# Patient Record
Sex: Female | Born: 1980 | Race: Black or African American | Hispanic: No | Marital: Single | Smoking: Never smoker
Health system: Southern US, Community
[De-identification: ages and names within clinical notes are randomized; demographics above are authoritative.]

## PROBLEM LIST (undated history)

## (undated) ENCOUNTER — Emergency Department (HOSPITAL_COMMUNITY): Discharge status: Against Medical Advice | Payer: Self-pay

## (undated) DIAGNOSIS — Z789 Other specified health status: Secondary | ICD-10-CM

## (undated) HISTORY — PX: COLON SURGERY: SHX602

---

## 2003-11-21 HISTORY — PX: TUBAL LIGATION: SHX77

## 2011-08-25 ENCOUNTER — Emergency Department (HOSPITAL_COMMUNITY)
Admission: EM | Admit: 2011-08-25 | Discharge: 2011-08-26 | Disposition: A | Discharge status: Home / Self Care | Payer: Self-pay | Attending: Emergency Medicine | Admitting: Emergency Medicine

## 2011-08-25 DIAGNOSIS — M722 Plantar fascial fibromatosis: Secondary | ICD-10-CM | POA: Insufficient documentation

## 2011-08-25 DIAGNOSIS — M7730 Calcaneal spur, unspecified foot: Secondary | ICD-10-CM | POA: Insufficient documentation

## 2011-08-25 DIAGNOSIS — M79609 Pain in unspecified limb: Secondary | ICD-10-CM | POA: Insufficient documentation

## 2011-08-26 ENCOUNTER — Emergency Department (HOSPITAL_COMMUNITY): Payer: Self-pay

## 2013-10-09 ENCOUNTER — Emergency Department (HOSPITAL_COMMUNITY)
Admission: EM | Admit: 2013-10-09 | Discharge: 2013-10-09 | Disposition: A | Discharge status: Home / Self Care | Payer: Self-pay | Attending: Emergency Medicine | Admitting: Emergency Medicine

## 2013-10-09 ENCOUNTER — Emergency Department (HOSPITAL_COMMUNITY): Payer: Self-pay

## 2013-10-09 DIAGNOSIS — M25561 Pain in right knee: Secondary | ICD-10-CM

## 2013-10-09 DIAGNOSIS — M25569 Pain in unspecified knee: Secondary | ICD-10-CM | POA: Insufficient documentation

## 2013-10-09 LAB — D-DIMER, QUANTITATIVE: D-Dimer, Quant: <0.27 ug/mL-FEU (ref 0.00–0.48)

## 2013-10-09 NOTE — ED Provider Notes (Signed)
CSN: [id number]     Arrival date & time 10/09/13  1514 History  This chart was scribed for non-physician practitioner working with Flint Melter, MD by Ashley Jacobs, ED scribe. This patient was seen in room WTR8/WTR8 and the patient's care was started at 3:33 PM.  First MD Initiated Contact with Patient 10/09/13 1542     Chief Complaint  Patient presents with  . Knee Pain   (Consider location/radiation/quality/duration/timing/severity/associated sxs/prior Treatment) The history is provided by the patient and medical records. No language interpreter was used.   HPI Comments: Gabriella Castillo is a 32 y.o. female who presents to the Emergency Department complaining of constant, moderate R knee pain the is gradually worsening for the past two weeks.  She denies taking anything for pain other than elevating her knee to aleve swelling. Pt states the pain is radiates from her R knee to her R dorsal calf and shins. The pain is worse is when she flexes her knee or ambulates. She currently works two jobs and lives a busy lifestyle. Pt denies recent surgeries, blood clotting disorder or any injuries. Pt also denies any prior similar incident. She does not have any allergies to medications or any prior medical complications.  No past medical history on file. No past surgical history on file. No family history on file. History  Substance Use Topics  . Smoking status: Not on file  . Smokeless tobacco: Not on file  . Alcohol Use: Not on file   OB History   No data available     Review of Systems  Musculoskeletal: Positive for arthralgias, joint swelling and myalgias.       Right knee pain  Hematological: Does not bruise/bleed easily.  All other systems reviewed and are negative.    Allergies  Review of patient's allergies indicates not on file.  Home Medications  No current outpatient prescriptions on file. BP 122/52  Pulse 89  Temp(Src) 98.6 F (37 C) (Oral)  Resp 16  SpO2  100% Physical Exam  Nursing note and vitals reviewed. Constitutional: She is oriented to person, place, and time. She appears well-developed and well-nourished. No distress.  HENT:  Head: Normocephalic and atraumatic.  Right Ear: External ear normal.  Left Ear: External ear normal.  Nose: Nose normal.  Mouth/Throat: Oropharynx is clear and moist.  Eyes: Conjunctivae and EOM are normal. Pupils are equal, round, and reactive to light.  Neck: Normal range of motion. Neck supple. No tracheal deviation present.  Cardiovascular: Normal rate, regular rhythm and normal heart sounds.   Pulmonary/Chest: Effort normal and breath sounds normal. No stridor. No respiratory distress. She has no wheezes. She has no rales.  Abdominal: Soft. She exhibits no distension.  Musculoskeletal: Normal range of motion.       Right knee: She exhibits swelling. She exhibits normal range of motion, no ecchymosis, no deformity, no laceration and no erythema. Tenderness found. Medial joint line, lateral joint line and patellar tendon tenderness noted.  TTP diffusely over right knee. No apprehension sign. Joint stable. Mild swelling and crepitus noted. Compartment soft, no erythema, streaking. Neurovascularly intact.   Neurological: She is alert and oriented to person, place, and time. She has normal strength.  Skin: Skin is warm and dry. She is not diaphoretic. No erythema.  Psychiatric: She has a normal mood and affect. Her behavior is normal.    ED Course  Procedures (including critical care time) DIAGNOSTIC STUDIES: Oxygen Saturation is 100% on room air, normal by my interpretation.  COORDINATION OF CARE: 3:47 PM Discussed course of care with pt which includes right knee x-ray. Pt understands and agrees.  Labs Review Labs Reviewed  D-DIMER, QUANTITATIVE   Imaging Review Dg Knee Complete 4 Views Right  10/09/2013   CLINICAL DATA:  Knee pain.  EXAM: RIGHT KNEE - COMPLETE 4+ VIEW  COMPARISON:  None.   FINDINGS: There is no evidence of fracture, dislocation, or joint effusion. There is no evidence of arthropathy or other focal bone abnormality. Soft tissues are unremarkable.  IMPRESSION: Negative.   Electronically Signed   By: Maisie Fus  Register   On: 10/09/2013 16:40    EKG Interpretation   None       MDM   1. Right knee pain    Patient presents with 2 weeks of right knee pain and swelling. The pain radiates into her right calf. D-dimer was negative and x-ray is unremarkable. No concern for DVT. She was given a knee sleeve and instructed to followup. Return instructions given. Vital signs stable for discharge. Patient / Family / Caregiver informed of clinical course, understand medical decision-making process, and agree with plan.   I personally performed the services described in this documentation, which was scribed in my presence. The recorded information has been reviewed and is accurate.    Mora Bellman, PA-C 10/09/13 1815

## 2013-10-09 NOTE — ED Notes (Signed)
Pt c/o R knee pain and swelling for the past 2 weeks. Pt denies any injury or trauma to knee. Pt ambulatory to exam room with steady gait.

## 2013-10-09 NOTE — ED Provider Notes (Signed)
Medical screening examination/treatment/procedure(s) were performed by non-physician practitioner and as supervising physician I was immediately available for consultation/collaboration.  Flint Melter, MD 10/09/13 [phone]
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
## 2015-11-01 ENCOUNTER — Emergency Department (HOSPITAL_COMMUNITY)
Admission: EM | Admit: 2015-11-01 | Discharge: 2015-11-01 | Disposition: A | Discharge status: Home / Self Care | Payer: 59 | Attending: Emergency Medicine | Admitting: Emergency Medicine

## 2015-11-01 ENCOUNTER — Encounter (HOSPITAL_COMMUNITY): Payer: Self-pay | Admitting: Emergency Medicine

## 2015-11-01 ENCOUNTER — Emergency Department (HOSPITAL_COMMUNITY): Payer: 59

## 2015-11-01 DIAGNOSIS — R2241 Localized swelling, mass and lump, right lower limb: Secondary | ICD-10-CM | POA: Insufficient documentation

## 2015-11-01 DIAGNOSIS — M25561 Pain in right knee: Secondary | ICD-10-CM | POA: Insufficient documentation

## 2015-11-01 MED ORDER — IBUPROFEN 200 MG PO TABS
600.0000 mg | ORAL_TABLET | Freq: Once | ORAL | Status: AC
  Administered 2015-11-01: 600 mg via ORAL
  Filled 2015-11-01: qty 3

## 2015-11-01 MED ORDER — IBUPROFEN 600 MG PO TABS: 600.0000 mg | ORAL_TABLET | Freq: Four times a day (QID) | ORAL | Status: AC | PRN

## 2015-11-01 NOTE — ED Notes (Signed)
PT RETURNED FROM XRAY.

## 2015-11-01 NOTE — ED Notes (Signed)
INITIAL ASSESSMENT COMPLETED. PT C/O RIGHT KNEE PAIN AND SWELLING X1 WEEK. DENIES INJURY. AWAITING FURTHER ORDERS.

## 2015-11-01 NOTE — ED Notes (Signed)
Pt reports waking up with knee pain and swelling last week. Wanted to give it a week to see if it would get better, but it has gotten worse. Pt does not recall any injury or precipitating factors. Pain 8/10. Denies taking any medication for it.

## 2015-11-01 NOTE — ED Provider Notes (Signed)
CSN: [id number]     Arrival date & time 11/01/15  0029 History   First MD Initiated Contact with Patient 11/01/15 0047     Chief Complaint  Patient presents with  . Knee Pain     (Consider location/radiation/quality/duration/timing/severity/associated sxs/prior Treatment) Patient is a 34 y.o. female presenting with knee pain. The history is provided by the patient.  Knee Pain Location:  Knee Time since incident:  1 week Injury: no   Knee location:  R knee Pain details:    Quality:  Dull   Radiates to:  Does not radiate   Severity:  Moderate   Onset quality:  Gradual   Duration:  1 week   Timing:  Constant   Progression:  Worsening Chronicity:  New Dislocation: no   Foreign body present:  No foreign bodies Tetanus status:  Unknown Prior injury to area:  No Relieved by:  None tried Worsened by:  Activity Ineffective treatments:  None tried   History reviewed. No pertinent past medical history. No past surgical history on file. History reviewed. No pertinent family history. Social History  Substance Use Topics  . Smoking status: None  . Smokeless tobacco: None  . Alcohol Use: None   OB History    No data available     Review of Systems  Constitutional: Negative for chills.  Musculoskeletal: Positive for joint swelling and gait problem.  Skin: Negative for color change.      Allergies  Review of patient's allergies indicates no known allergies.  Home Medications   Prior to Admission medications   Medication Sig Start Date End Date Taking? Authorizing Provider  ibuprofen (ADVIL,MOTRIN) 600 MG tablet Take 1 tablet (600 mg total) by mouth every 6 (six) hours as needed. 11/01/15   Earley FavorGail Gelsey Amyx, NP   BP 111/68 mmHg  Pulse 69  Temp(Src) 98.2 F (36.8 C) (Oral)  Resp 18  Ht 5' 11.5" (1.816 m)  Wt 87.998 kg  BMI 26.68 kg/m2  SpO2 98%  LMP 10/18/2015 Physical Exam  Constitutional: She appears well-developed and well-nourished.  HENT:  Head:  Normocephalic.  Eyes: Pupils are equal, round, and reactive to light.  Neck: Normal range of motion.  Cardiovascular: Normal rate.   Pulmonary/Chest: Effort normal.  Musculoskeletal: Normal range of motion. She exhibits tenderness. She exhibits no edema.       Right knee: She exhibits swelling. She exhibits normal range of motion, no ecchymosis, no deformity, no laceration, no erythema, normal alignment and no bony tenderness. Tenderness found. Medial joint line tenderness noted.  Neurological: She is alert.  Skin: Skin is warm.  Psychiatric: She has a normal mood and affect.  Nursing note and vitals reviewed.   ED Course  Procedures (including critical care time) Labs Review Labs Reviewed - No data to display  Imaging Review Dg Knee Complete 4 Views Right  11/01/2015  CLINICAL DATA:  Lateral to mid right knee pain today. No injury. Knot on the lateral side of the patella. EXAM: RIGHT KNEE - COMPLETE 4+ VIEW COMPARISON:  10/09/2013 FINDINGS: There is no evidence of fracture, dislocation, or joint effusion. There is no evidence of arthropathy or other focal bone abnormality. Soft tissues are unremarkable. IMPRESSION: Negative. Electronically Signed   By: Burman NievesWilliam  Stevens M.D.   On: 11/01/2015 01:38   I have personally reviewed and evaluated these images and lab results as part of my medical decision-making.   EKG Interpretation None    will treat with knee sleeve and antiinflamatories  MDM  Final diagnoses:  Right knee pain         Earley Favor, NP 11/01/15 0217  April Palumbo, MD 12/15/15 2330

## 2015-11-01 NOTE — Discharge Instructions (Signed)
How to Use a Knee Brace °A knee brace is a device that you wear to support your knee, especially if the knee is healing after an injury or surgery. There are several types of knee braces. Some are designed to prevent an injury (prophylactic brace). These are often worn during sports. Others support an injured knee (functional brace) or keep it still while it heals (rehabilitative brace). People with severe arthritis of the knee may benefit from a brace that takes some pressure off the knee (unloader brace). Most knee braces are made from a combination of cloth and metal or plastic.  °You may need to wear a knee brace to: °· Relieve knee pain. °· Help your knee support your weight (improve stability). °· Help you walk farther (improve mobility). °· Prevent injury. °· Support your knee while it heals from surgery or from an injury. °RISKS AND COMPLICATIONS °Generally, knee braces are very safe to wear. However, problems may occur, including: °· Skin irritation that may lead to infection. °· Making your condition worse if you wear the brace in the wrong way. °HOW TO USE A KNEE BRACE °Different braces will have different instructions for use. Your health care provider will tell you or show you: °· How to put on your brace. °· How to adjust the brace. °· When and how often to wear the brace. °· How to remove the brace. °· If you will need any assistive devices in addition to the brace, such as crutches or a cane. °In general, your brace should: °· Have the hinge of the brace line up with the bend of your knee. °· Have straps, hooks, or tapes that fasten snugly around your leg. °· Not feel too tight or too loose.  °HOW TO CARE FOR A KNEE BRACE °· Check your brace often for signs of damage, such as loose connections or attachments. Your knee brace may get damaged or wear out during normal use. °· Wash the fabric parts of your brace with soap and water. °· Read the insert that comes with your brace for other specific care  instructions.  °SEEK MEDICAL CARE IF: °· Your knee brace is too loose or too tight and you cannot adjust it. °· Your knee brace causes skin redness, swelling, bruising, or irritation. °· Your knee brace is not helping. °· Your knee brace is making your knee pain worse. °  °This information is not intended to replace advice given to you by your health care provider. Make sure you discuss any questions you have with your health care provider. °  °Document Released: 01/27/2004 Document Revised: 07/28/2015 Document Reviewed: 03/01/2015 °Elsevier Interactive Patient Education ©2016 [employer] ° °Heat Therapy °Heat therapy can help ease sore, stiff, injured, and tight muscles and joints. Heat relaxes your muscles, which may help ease your pain. Heat therapy should only be used on old, pre-existing, or long-lasting (chronic) injuries. Do not use heat therapy unless told by your doctor. °HOW TO USE HEAT THERAPY °There are several different kinds of heat therapy, including: °· Moist heat pack. °· Warm water bath. °· Hot water bottle. °· Electric heating pad. °· Heated gel pack. °· Heated wrap. °· Electric heating pad. °GENERAL HEAT THERAPY RECOMMENDATIONS  °· Do not sleep while using heat therapy. Only use heat therapy while you are awake. °· Your skin may turn pink while using heat therapy. Do not use heat therapy if your skin turns red. °· Do not use heat therapy if you have new pain. °· High heat   or long exposure to heat can cause burns. Be careful when using heat therapy to avoid burning your skin.  Do not use heat therapy on areas of your skin that are already irritated, such as with a rash or sunburn. GET HELP IF:   You have blisters, redness, swelling (puffiness), or numbness.  You have new pain.  Your pain is worse. MAKE SURE YOU:  Understand these instructions.  Will watch your condition.  Will get help right away if you are not doing well or get worse.   This information is not intended to  replace advice given to you by your health care provider. Make sure you discuss any questions you have with your health care provider.   Document Released: 01/29/2012 Document Revised: 11/27/2014 Document Reviewed: 12/30/2013 Elsevier Interactive Patient Education 2016 [employer] Your xray is normal please wear the sleeve while up and about for the next several weeks  Take the Ibuprofen on a regular basis as well

## 2015-11-01 NOTE — ED Notes (Signed)
PT DISCHARGED. INSTRUCTIONS AND PRESCRIPTION GIVEN. AAOX3. PT IN NO APPARENT DISTRESS. THE OPPORTUNITY TO ASK QUESTIONS WAS PROVIDED. 

## 2015-11-01 NOTE — ED Notes (Signed)
Patient transported to X-ray 

## 2016-02-23 ENCOUNTER — Emergency Department (HOSPITAL_COMMUNITY): Payer: 59

## 2016-02-23 ENCOUNTER — Inpatient Hospital Stay (HOSPITAL_COMMUNITY)
Admission: EM | Admit: 2016-02-23 | Discharge: 2016-02-28 | DRG: 330 | Disposition: A | Discharge status: Home Health | Payer: 59 | Attending: Surgery | Admitting: Surgery

## 2016-02-23 ENCOUNTER — Encounter (HOSPITAL_COMMUNITY): Payer: Self-pay | Admitting: *Deleted

## 2016-02-23 ENCOUNTER — Encounter (HOSPITAL_COMMUNITY): Admission: EM | Disposition: A | Discharge status: Home Health | Payer: 59 | Source: Home / Self Care

## 2016-02-23 ENCOUNTER — Emergency Department (HOSPITAL_COMMUNITY): Payer: 59 | Admitting: Certified Registered"

## 2016-02-23 DIAGNOSIS — K567 Ileus, unspecified: Secondary | ICD-10-CM | POA: Diagnosis not present

## 2016-02-23 DIAGNOSIS — S31600A Unspecified open wound of abdominal wall, right upper quadrant with penetration into peritoneal cavity, initial encounter: Principal | ICD-10-CM | POA: Diagnosis present

## 2016-02-23 DIAGNOSIS — T148 Other injury of unspecified body region: Secondary | ICD-10-CM | POA: Diagnosis present

## 2016-02-23 DIAGNOSIS — W3400XA Accidental discharge from unspecified firearms or gun, initial encounter: Secondary | ICD-10-CM

## 2016-02-23 DIAGNOSIS — Z9889 Other specified postprocedural states: Secondary | ICD-10-CM

## 2016-02-23 DIAGNOSIS — S31601A Unspecified open wound of abdominal wall, left upper quadrant with penetration into peritoneal cavity, initial encounter: Secondary | ICD-10-CM | POA: Diagnosis present

## 2016-02-23 DIAGNOSIS — D62 Acute posthemorrhagic anemia: Secondary | ICD-10-CM | POA: Diagnosis not present

## 2016-02-23 DIAGNOSIS — S36409A Unspecified injury of unspecified part of small intestine, initial encounter: Secondary | ICD-10-CM | POA: Diagnosis present

## 2016-02-23 DIAGNOSIS — S31139A Puncture wound of abdominal wall without foreign body, unspecified quadrant without penetration into peritoneal cavity, initial encounter: Secondary | ICD-10-CM

## 2016-02-23 HISTORY — PX: SMALL INTESTINE SURGERY: SHX150

## 2016-02-23 HISTORY — PX: EXPLORATORY LAPAROTOMY: SUR591

## 2016-02-23 HISTORY — PX: LAPAROTOMY: SHX154

## 2016-02-23 HISTORY — DX: Other specified health status: Z78.9

## 2016-02-23 LAB — PREPARE FRESH FROZEN PLASMA
Unit division: 0
Unit division: 0

## 2016-02-23 LAB — PROTIME-INR
INR: 1.02 (ref 0.00–1.49)
Prothrombin Time: 13.6 seconds (ref 11.6–15.2)

## 2016-02-23 LAB — TYPE AND SCREEN
ABO/RH(D): B POS
Antibody Screen: NEGATIVE
UNIT DIVISION: 0
Unit division: 0

## 2016-02-23 LAB — COMPREHENSIVE METABOLIC PANEL
ALBUMIN: 3.6 g/dL (ref 3.5–5.0)
ALK PHOS: 48 U/L (ref 38–126)
ALK PHOS: 46 U/L (ref 38–126)
ALT: 16 U/L (ref 14–54)
ALT: 20 U/L (ref 14–54)
AST: 19 U/L (ref 15–41)
AST: 26 U/L (ref 15–41)
Albumin: 3.7 g/dL (ref 3.5–5.0)
Anion gap: 14 (ref 5–15)
Anion gap: 13 (ref 5–15)
BILIRUBIN TOTAL: 0.5 mg/dL (ref 0.3–1.2)
BUN: 14 mg/dL (ref 6–20)
BUN: 9 mg/dL (ref 6–20)
CALCIUM: 8.9 mg/dL (ref 8.9–10.3)
CALCIUM: 8.4 mg/dL — AB (ref 8.9–10.3)
CO2: 19 mmol/L — AB (ref 22–32)
CO2: 23 mmol/L (ref 22–32)
CREATININE: 0.95 mg/dL (ref 0.44–1.00)
Chloride: 105 mmol/L (ref 101–111)
Chloride: 103 mmol/L (ref 101–111)
Creatinine, Ser: 0.75 mg/dL (ref 0.44–1.00)
GFR calc Af Amer: >60 mL/min (ref >60)
GFR calc non Af Amer: >60 mL/min (ref >60)
GFR calc non Af Amer: >60 mL/min (ref >60)
GLUCOSE: 142 mg/dL — AB (ref 65–99)
Glucose, Bld: 134 mg/dL — ABNORMAL HIGH (ref 65–99)
Potassium: 3.4 mmol/L — ABNORMAL LOW (ref 3.5–5.1)
Potassium: 3.9 mmol/L (ref 3.5–5.1)
SODIUM: 138 mmol/L (ref 135–145)
Sodium: 139 mmol/L (ref 135–145)
TOTAL PROTEIN: 6.5 g/dL (ref 6.5–8.1)
Total Bilirubin: 0.4 mg/dL (ref 0.3–1.2)
Total Protein: 6.8 g/dL (ref 6.5–8.1)

## 2016-02-23 LAB — I-STAT BETA HCG BLOOD, ED (MC, WL, AP ONLY)

## 2016-02-23 LAB — CDS SEROLOGY

## 2016-02-23 LAB — CBC
HCT: 38.6 % (ref 36.0–46.0)
HEMATOCRIT: 43 % (ref 36.0–46.0)
HEMOGLOBIN: 14.1 g/dL (ref 12.0–15.0)
Hemoglobin: 12.3 g/dL (ref 12.0–15.0)
MCH: 29 pg (ref 26.0–34.0)
MCH: 29.5 pg (ref 26.0–34.0)
MCHC: 31.9 g/dL (ref 30.0–36.0)
MCHC: 32.8 g/dL (ref 30.0–36.0)
MCV: 91 fL (ref 78.0–100.0)
MCV: 90 fL (ref 78.0–100.0)
PLATELETS: 214 10*3/uL (ref 150–400)
Platelets: 143 10*3/uL — ABNORMAL LOW (ref 150–400)
RBC: 4.24 MIL/uL (ref 3.87–5.11)
RBC: 4.78 MIL/uL (ref 3.87–5.11)
RDW: 12.7 % (ref 11.5–15.5)
RDW: 12.8 % (ref 11.5–15.5)
WBC: 8.8 10*3/uL (ref 4.0–10.5)
WBC: 9.9 10*3/uL (ref 4.0–10.5)

## 2016-02-23 LAB — ABO/RH: ABO/RH(D): B POS

## 2016-02-23 LAB — ETHANOL

## 2016-02-23 SURGERY — LAPAROTOMY, EXPLORATORY
Anesthesia: General | Site: Abdomen

## 2016-02-23 MED ORDER — ONDANSETRON 4 MG PO TBDP: 4.0000 mg | ORAL_TABLET | Freq: Four times a day (QID) | ORAL | Status: DC | PRN

## 2016-02-23 MED ORDER — 0.9 % SODIUM CHLORIDE (POUR BTL) OPTIME
TOPICAL | Status: DC | PRN
  Administered 2016-02-23: 5000 mL

## 2016-02-23 MED ORDER — FENTANYL CITRATE (PF) 250 MCG/5ML IJ SOLN
INTRAMUSCULAR | Status: AC
  Filled 2016-02-23: qty 5

## 2016-02-23 MED ORDER — SUGAMMADEX SODIUM 500 MG/5ML IV SOLN
INTRAVENOUS | Status: AC
  Filled 2016-02-23: qty 5

## 2016-02-23 MED ORDER — PROPOFOL 10 MG/ML IV BOLUS
INTRAVENOUS | Status: DC | PRN
  Administered 2016-02-23: 130 mg via INTRAVENOUS

## 2016-02-23 MED ORDER — ONDANSETRON HCL 4 MG/2ML IJ SOLN
INTRAMUSCULAR | Status: DC | PRN
  Administered 2016-02-23: 4 mg via INTRAVENOUS

## 2016-02-23 MED ORDER — DIPHENHYDRAMINE HCL 50 MG/ML IJ SOLN: 12.5000 mg | Freq: Four times a day (QID) | INTRAMUSCULAR | Status: DC | PRN

## 2016-02-23 MED ORDER — LIDOCAINE HCL (CARDIAC) 20 MG/ML IV SOLN
INTRAVENOUS | Status: AC
  Filled 2016-02-23: qty 10

## 2016-02-23 MED ORDER — SODIUM CHLORIDE 0.9 % IV SOLN
INTRAVENOUS | Status: DC | PRN
  Administered 2016-02-23: 02:00:00 via INTRAVENOUS

## 2016-02-23 MED ORDER — SUGAMMADEX SODIUM 200 MG/2ML IV SOLN
INTRAVENOUS | Status: DC | PRN
  Administered 2016-02-23: 200 mg via INTRAVENOUS

## 2016-02-23 MED ORDER — ONDANSETRON HCL 4 MG/2ML IJ SOLN
4.0000 mg | Freq: Once | INTRAMUSCULAR | Status: AC
  Administered 2016-02-23: 4 mg via INTRAVENOUS

## 2016-02-23 MED ORDER — ONDANSETRON HCL 4 MG/2ML IJ SOLN: 4.0000 mg | Freq: Four times a day (QID) | INTRAMUSCULAR | Status: DC | PRN

## 2016-02-23 MED ORDER — NALOXONE HCL 0.4 MG/ML IJ SOLN: 0.4000 mg | INTRAMUSCULAR | Status: DC | PRN

## 2016-02-23 MED ORDER — PIPERACILLIN-TAZOBACTAM 3.375 G IVPB
3.3750 g | Freq: Three times a day (TID) | INTRAVENOUS | Status: AC
  Administered 2016-02-23 – 2016-02-27 (×12): 3.375 g via INTRAVENOUS
  Filled 2016-02-23 (×15): qty 50

## 2016-02-23 MED ORDER — ENOXAPARIN SODIUM 40 MG/0.4ML ~~LOC~~ SOLN
40.0000 mg | SUBCUTANEOUS | Status: DC
  Administered 2016-02-24 – 2016-02-28 (×5): 40 mg via SUBCUTANEOUS
  Filled 2016-02-23 (×4): qty 0.4

## 2016-02-23 MED ORDER — ONDANSETRON HCL 4 MG/2ML IJ SOLN: 4.0000 mg | Freq: Once | INTRAMUSCULAR | Status: DC | PRN

## 2016-02-23 MED ORDER — LIDOCAINE HCL (CARDIAC) 20 MG/ML IV SOLN
INTRAVENOUS | Status: DC | PRN
  Administered 2016-02-23: 100 mg via INTRAVENOUS

## 2016-02-23 MED ORDER — PHENYLEPHRINE HCL 10 MG/ML IJ SOLN
INTRAMUSCULAR | Status: DC | PRN
Start: 2016-02-23 — End: 2016-02-23
  Administered 2016-02-23: 160 ug via INTRAVENOUS
  Administered 2016-02-23: 80 ug via INTRAVENOUS

## 2016-02-23 MED ORDER — GLYCOPYRROLATE 0.2 MG/ML IJ SOLN
INTRAMUSCULAR | Status: AC
  Filled 2016-02-23: qty 3

## 2016-02-23 MED ORDER — IOPAMIDOL (ISOVUE-300) INJECTION 61%
100.0000 mL | Freq: Once | INTRAVENOUS | Status: AC | PRN
Start: 2016-02-23 — End: 2016-02-23
  Administered 2016-02-23: 100 mL via INTRAVENOUS

## 2016-02-23 MED ORDER — ROCURONIUM BROMIDE 100 MG/10ML IV SOLN
INTRAVENOUS | Status: DC | PRN
  Administered 2016-02-23: 25 mg via INTRAVENOUS

## 2016-02-23 MED ORDER — SUGAMMADEX SODIUM 200 MG/2ML IV SOLN
INTRAVENOUS | Status: AC
  Filled 2016-02-23: qty 2

## 2016-02-23 MED ORDER — PANTOPRAZOLE SODIUM 40 MG IV SOLR
40.0000 mg | Freq: Every day | INTRAVENOUS | Status: DC
  Administered 2016-02-23 – 2016-02-26 (×4): 40 mg via INTRAVENOUS
  Filled 2016-02-23 (×4): qty 40

## 2016-02-23 MED ORDER — MEPERIDINE HCL 25 MG/ML IJ SOLN: 6.2500 mg | INTRAMUSCULAR | Status: DC | PRN

## 2016-02-23 MED ORDER — HYDROMORPHONE HCL 1 MG/ML IJ SOLN: 0.2500 mg | INTRAMUSCULAR | Status: DC | PRN

## 2016-02-23 MED ORDER — MIDAZOLAM HCL 2 MG/2ML IJ SOLN
INTRAMUSCULAR | Status: AC
  Filled 2016-02-23: qty 2

## 2016-02-23 MED ORDER — ROCURONIUM BROMIDE 50 MG/5ML IV SOLN
INTRAVENOUS | Status: AC
  Filled 2016-02-23: qty 1

## 2016-02-23 MED ORDER — MIDAZOLAM HCL 5 MG/5ML IJ SOLN
INTRAMUSCULAR | Status: DC | PRN
  Administered 2016-02-23 (×2): 1 mg via INTRAVENOUS

## 2016-02-23 MED ORDER — DEXTROSE 5 % IV SOLN
2.0000 g | Freq: Once | INTRAVENOUS | Status: AC
  Administered 2016-02-23: 2 g via INTRAVENOUS
  Filled 2016-02-23: qty 2

## 2016-02-23 MED ORDER — DEXAMETHASONE SODIUM PHOSPHATE 4 MG/ML IJ SOLN
INTRAMUSCULAR | Status: DC | PRN
  Administered 2016-02-23: 4 mg via INTRAVENOUS

## 2016-02-23 MED ORDER — FENTANYL CITRATE (PF) 100 MCG/2ML IJ SOLN
INTRAMUSCULAR | Status: DC | PRN
  Administered 2016-02-23 (×2): 50 ug via INTRAVENOUS
  Administered 2016-02-23: 100 ug via INTRAVENOUS

## 2016-02-23 MED ORDER — HYDROMORPHONE 1 MG/ML IV SOLN
INTRAVENOUS | Status: AC
  Filled 2016-02-23: qty 25

## 2016-02-23 MED ORDER — DIPHENHYDRAMINE HCL 12.5 MG/5ML PO ELIX: 12.5000 mg | ORAL_SOLUTION | Freq: Four times a day (QID) | ORAL | Status: DC | PRN

## 2016-02-23 MED ORDER — PHENYLEPHRINE HCL 10 MG/ML IJ SOLN
10.0000 mg | INTRAVENOUS | Status: DC | PRN
  Administered 2016-02-23: 50 ug/min via INTRAVENOUS

## 2016-02-23 MED ORDER — HYDROMORPHONE 1 MG/ML IV SOLN
INTRAVENOUS | Status: DC
  Administered 2016-02-23 (×2): 0.6 mg via INTRAVENOUS
  Administered 2016-02-23: 0.9 mg via INTRAVENOUS
  Administered 2016-02-23: 04:00:00 via INTRAVENOUS
  Administered 2016-02-23: 0.6 mg via INTRAVENOUS
  Administered 2016-02-24 (×2): 0.3 mg via INTRAVENOUS
  Administered 2016-02-24: 1.2 mg via INTRAVENOUS
  Administered 2016-02-24: 0.3 mg via INTRAVENOUS
  Administered 2016-02-24: 0.6 mg via INTRAVENOUS
  Administered 2016-02-24 – 2016-02-25 (×3): 0.3 mg via INTRAVENOUS
  Administered 2016-02-25: 0.6 mg via INTRAVENOUS
  Administered 2016-02-25 (×2): 0.3 mg via INTRAVENOUS
  Administered 2016-02-25: 1.2 mg via INTRAVENOUS
  Administered 2016-02-26: 0.9 mg via INTRAVENOUS
  Administered 2016-02-26 (×2): 0.3 mg via INTRAVENOUS

## 2016-02-23 MED ORDER — SODIUM CHLORIDE 0.9% FLUSH: 9.0000 mL | INTRAVENOUS | Status: DC | PRN

## 2016-02-23 MED ORDER — POTASSIUM CHLORIDE IN NACL 20-0.9 MEQ/L-% IV SOLN
INTRAVENOUS | Status: DC
  Administered 2016-02-23 – 2016-02-26 (×6): via INTRAVENOUS
  Filled 2016-02-23 (×5): qty 1000
  Filled 2016-02-23: qty 2000
  Filled 2016-02-23 (×2): qty 1000

## 2016-02-23 MED ORDER — LACTATED RINGERS IV SOLN
INTRAVENOUS | Status: DC | PRN
  Administered 2016-02-23 (×2): via INTRAVENOUS

## 2016-02-23 MED ORDER — ONDANSETRON HCL 4 MG/2ML IJ SOLN
INTRAMUSCULAR | Status: AC
  Filled 2016-02-23: qty 2

## 2016-02-23 MED ORDER — MENTHOL 3 MG MT LOZG
1.0000 | LOZENGE | OROMUCOSAL | Status: DC | PRN
  Filled 2016-02-23: qty 9

## 2016-02-23 MED ORDER — SODIUM CHLORIDE 0.9 % IJ SOLN
INTRAMUSCULAR | Status: AC
  Filled 2016-02-23: qty 10

## 2016-02-23 MED ORDER — SUCCINYLCHOLINE CHLORIDE 20 MG/ML IJ SOLN
INTRAMUSCULAR | Status: DC | PRN
  Administered 2016-02-23: 140 mg via INTRAVENOUS

## 2016-02-23 SURGICAL SUPPLY — 43 items
BLADE SURG ROTATE 9660 (MISCELLANEOUS) ×3 IMPLANT
BNDG GAUZE ELAST 4 BULKY (GAUZE/BANDAGES/DRESSINGS) ×3 IMPLANT
CANISTER SUCTION 2500CC (MISCELLANEOUS) ×3 IMPLANT
CHLORAPREP W/TINT 26ML (MISCELLANEOUS) ×3 IMPLANT
COVER SURGICAL LIGHT HANDLE (MISCELLANEOUS) ×3 IMPLANT
DRAPE LAPAROSCOPIC ABDOMINAL (DRAPES) ×3 IMPLANT
DRAPE WARM FLUID 44X44 (DRAPE) ×3 IMPLANT
DRSG OPSITE POSTOP 4X10 (GAUZE/BANDAGES/DRESSINGS) IMPLANT
DRSG OPSITE POSTOP 4X8 (GAUZE/BANDAGES/DRESSINGS) IMPLANT
DRSG PAD ABDOMINAL 8X10 ST (GAUZE/BANDAGES/DRESSINGS) ×3 IMPLANT
ELECT BLADE 6.5 EXT (BLADE) IMPLANT
ELECT CAUTERY BLADE 6.4 (BLADE) ×6 IMPLANT
ELECT REM PT RETURN 9FT ADLT (ELECTROSURGICAL) ×3
ELECTRODE REM PT RTRN 9FT ADLT (ELECTROSURGICAL) ×1 IMPLANT
GAUZE SPONGE 4X4 12PLY STRL (GAUZE/BANDAGES/DRESSINGS) ×3 IMPLANT
GLOVE BIO SURGEON STRL SZ7 (GLOVE) ×3 IMPLANT
GLOVE BIOGEL PI IND STRL 7.5 (GLOVE) ×1 IMPLANT
GLOVE BIOGEL PI INDICATOR 7.5 (GLOVE) ×2
GOWN STRL REUS W/ TWL LRG LVL3 (GOWN DISPOSABLE) ×2 IMPLANT
GOWN STRL REUS W/TWL LRG LVL3 (GOWN DISPOSABLE) ×4
KIT BASIN OR (CUSTOM PROCEDURE TRAY) ×3 IMPLANT
KIT ROOM TURNOVER OR (KITS) ×3 IMPLANT
LIGASURE IMPACT 36 18CM CVD LR (INSTRUMENTS) ×3 IMPLANT
NS IRRIG 1000ML POUR BTL (IV SOLUTION) ×6 IMPLANT
PACK GENERAL/GYN (CUSTOM PROCEDURE TRAY) ×3 IMPLANT
PAD ARMBOARD 7.5X6 YLW CONV (MISCELLANEOUS) ×3 IMPLANT
RELOAD PROXIMATE 75MM BLUE (ENDOMECHANICALS) ×12 IMPLANT
SPECIMEN JAR LARGE (MISCELLANEOUS) IMPLANT
SPONGE LAP 18X18 X RAY DECT (DISPOSABLE) IMPLANT
STAPLER GUN LINEAR PROX 60 (STAPLE) ×3 IMPLANT
STAPLER PROXIMATE 75MM BLUE (STAPLE) ×3 IMPLANT
STAPLER VISISTAT 35W (STAPLE) ×3 IMPLANT
SUCTION POOLE TIP (SUCTIONS) ×3 IMPLANT
SUT PDS AB 1 TP1 96 (SUTURE) ×6 IMPLANT
SUT SILK 2 0 SH CR/8 (SUTURE) ×3 IMPLANT
SUT SILK 2 0 TIES 10X30 (SUTURE) ×3 IMPLANT
SUT SILK 3 0 SH CR/8 (SUTURE) ×3 IMPLANT
SUT SILK 3 0 TIES 10X30 (SUTURE) ×3 IMPLANT
SUT VIC AB 3-0 SH 18 (SUTURE) IMPLANT
TAPE CLOTH SURG 6X10 WHT LF (GAUZE/BANDAGES/DRESSINGS) ×3 IMPLANT
TOWEL OR 17X26 10 PK STRL BLUE (TOWEL DISPOSABLE) ×3 IMPLANT
TRAY FOLEY CATH 16FRSI W/METER (SET/KITS/TRAYS/PACK) ×3 IMPLANT
YANKAUER SUCT BULB TIP NO VENT (SUCTIONS) ×3 IMPLANT

## 2016-02-23 NOTE — ED Notes (Signed)
Pt returned from CT; c/o back pain and nausea with one episode of emesis
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
## 2016-02-23 NOTE — Anesthesia Procedure Notes (Signed)
Procedure Name: Intubation Date/Time: 02/23/2016 2:19 AM Performed by: Julianne RiceBILOTTA, Eyvonne Burchfield Z Pre-anesthesia Checklist: Patient identified, Timeout performed, Emergency Drugs available, Suction available and Patient being monitored Patient Re-evaluated:Patient Re-evaluated prior to inductionOxygen Delivery Method: Circle system utilized Preoxygenation: Pre-oxygenation with 100% oxygen Intubation Type: IV induction, Rapid sequence and Cricoid Pressure applied Laryngoscope Size: Mac and 3 Grade View: Grade I Tube type: Oral Tube size: 7.5 mm Number of attempts: 1 Airway Equipment and Method: Stylet Placement Confirmation: ETT inserted through vocal cords under direct vision,  breath sounds checked- equal and bilateral and positive ETCO2 Secured at: 22 cm Tube secured with: Tape Dental Injury: Teeth and Oropharynx as per pre-operative assessment

## 2016-02-23 NOTE — Transfer of Care (Signed)
Immediate Anesthesia Transfer of Care Note  Patient: Gabriella CannyLisa Castillo  Procedure(s) Performed: Procedure(s): EXPLORATORY LAPAROTOMY SMALL BOWEL RESECTION AND FOREIGN BODY REMOVAL (N/A)  Patient Location: PACU  Anesthesia Type:General  Level of Consciousness: awake and patient cooperative  Airway & Oxygen Therapy: Patient Spontanous Breathing and Patient connected to face mask oxygen  Post-op Assessment: Report given to RN and Post -op Vital signs reviewed and stable  Post vital signs: Reviewed and stable  Last Vitals:  Filed Vitals:   02/23/16 0154 02/23/16 0202  BP: 133/80 121/79  Pulse: 81 78  Temp:    Resp: 25 20    Complications: No apparent anesthesia complications
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
## 2016-02-23 NOTE — ED Notes (Signed)
Pt to CT with RN
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
## 2016-02-24 ENCOUNTER — Encounter (HOSPITAL_COMMUNITY): Payer: Self-pay | Admitting: Surgery

## 2016-02-24 LAB — BLOOD PRODUCT ORDER (VERBAL) VERIFICATION

## 2016-02-24 MED ORDER — PHENOL 1.4 % MT LIQD
1.0000 | OROMUCOSAL | Status: DC | PRN
  Administered 2016-02-24: 1 via OROMUCOSAL
  Filled 2016-02-24: qty 177

## 2016-02-24 MED ORDER — PHENOL 1.4 % MT LIQD: 1.0000 | OROMUCOSAL | Status: DC | PRN

## 2016-02-24 NOTE — Progress Notes (Signed)
Dressing changes done this am at 0515.  Midline incision 2 packed wet gauze, covered with dry gauze and ABD pad/with hypofix tape.  Left side and right side of abd cleansed and dry gauze with tape applied.  Patient tolerated dressing changes well with minimum discomfort or complaint.

## 2016-02-24 NOTE — Progress Notes (Signed)
Lozenges administered for c/o sore throat. Will administer chloraseptic spray if sore throat persists
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
## 2016-02-25 MED ORDER — ACETAMINOPHEN 325 MG PO TABS
650.0000 mg | ORAL_TABLET | Freq: Once | ORAL | Status: AC
  Administered 2016-02-25: 650 mg via ORAL
  Filled 2016-02-25: qty 2

## 2016-02-25 MED ORDER — BISACODYL 10 MG RE SUPP
10.0000 mg | Freq: Once | RECTAL | Status: AC
  Administered 2016-02-25: 10 mg via RECTAL
  Filled 2016-02-25: qty 1

## 2016-02-25 MED ORDER — WHITE PETROLATUM GEL
Status: AC
  Administered 2016-02-25: 15:00:00
  Filled 2016-02-25: qty 1

## 2016-02-25 MED ORDER — WHITE PETROLATUM GEL
Status: AC
  Filled 2016-02-25: qty 1
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
## 2016-02-26 LAB — URINALYSIS, ROUTINE W REFLEX MICROSCOPIC
BILIRUBIN URINE: NEGATIVE
Glucose, UA: NEGATIVE mg/dL
LEUKOCYTES UA: NEGATIVE
NITRITE: NEGATIVE
PROTEIN: NEGATIVE mg/dL
Specific Gravity, Urine: 1.015 (ref 1.005–1.030)
pH: 5.5 (ref 5.0–8.0)

## 2016-02-26 LAB — URINE MICROSCOPIC-ADD ON

## 2016-02-26 MED ORDER — HYDROMORPHONE HCL 1 MG/ML IJ SOLN
1.0000 mg | INTRAMUSCULAR | Status: DC | PRN
  Administered 2016-02-27: 1 mg via INTRAVENOUS
  Filled 2016-02-26: qty 1

## 2016-02-26 MED ORDER — OXYCODONE-ACETAMINOPHEN 5-325 MG PO TABS
1.0000 | ORAL_TABLET | ORAL | Status: DC | PRN
  Administered 2016-02-26 – 2016-02-27 (×2): 2 via ORAL
  Filled 2016-02-26 (×2): qty 2

## 2016-02-26 MED ORDER — IBUPROFEN 600 MG PO TABS
600.0000 mg | ORAL_TABLET | Freq: Three times a day (TID) | ORAL | Status: DC
  Administered 2016-02-26 – 2016-02-28 (×2): 600 mg via ORAL
  Filled 2016-02-26 (×6): qty 1
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
## 2016-02-27 LAB — CBC
HEMATOCRIT: 32.8 % — AB (ref 36.0–46.0)
Hemoglobin: 10.6 g/dL — ABNORMAL LOW (ref 12.0–15.0)
MCH: 28.9 pg (ref 26.0–34.0)
MCHC: 32.3 g/dL (ref 30.0–36.0)
MCV: 89.4 fL (ref 78.0–100.0)
PLATELETS: 209 10*3/uL (ref 150–400)
RBC: 3.67 MIL/uL — AB (ref 3.87–5.11)
RDW: 12.9 % (ref 11.5–15.5)
WBC: 6.6 10*3/uL (ref 4.0–10.5)

## 2016-02-27 LAB — BASIC METABOLIC PANEL
ANION GAP: 10 (ref 5–15)
CHLORIDE: 107 mmol/L (ref 101–111)
CO2: 22 mmol/L (ref 22–32)
Calcium: 8.3 mg/dL — ABNORMAL LOW (ref 8.9–10.3)
Creatinine, Ser: 0.69 mg/dL (ref 0.44–1.00)
GFR calc Af Amer: >60 mL/min (ref >60)
GLUCOSE: 100 mg/dL — AB (ref 65–99)
POTASSIUM: 3.8 mmol/L (ref 3.5–5.1)
Sodium: 139 mmol/L (ref 135–145)

## 2016-02-27 MED ORDER — SODIUM CHLORIDE 0.9% FLUSH: 3.0000 mL | INTRAVENOUS | Status: DC | PRN

## 2016-02-27 MED ORDER — SODIUM CHLORIDE 0.9% FLUSH: 3.0000 mL | Freq: Two times a day (BID) | INTRAVENOUS | Status: DC

## 2016-02-27 MED ORDER — DOCUSATE SODIUM 100 MG PO CAPS
100.0000 mg | ORAL_CAPSULE | Freq: Two times a day (BID) | ORAL | Status: DC
  Administered 2016-02-27 – 2016-02-28 (×3): 100 mg via ORAL
  Filled 2016-02-27 (×3): qty 1

## 2016-02-27 MED ORDER — POLYETHYLENE GLYCOL 3350 17 G PO PACK
17.0000 g | PACK | Freq: Every day | ORAL | Status: DC
  Administered 2016-02-27 – 2016-02-28 (×2): 17 g via ORAL
  Filled 2016-02-27 (×2): qty 1
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
## 2016-02-28 DIAGNOSIS — D62 Acute posthemorrhagic anemia: Secondary | ICD-10-CM | POA: Diagnosis not present

## 2016-02-28 MED ORDER — OXYCODONE-ACETAMINOPHEN 5-325 MG PO TABS: 1.0000 | ORAL_TABLET | ORAL | Status: AC | PRN
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
## 2016-02-28 NOTE — Discharge Instructions (Signed)
No driving while taking oxycodone.  No lifting more than 5 pounds for 6 weeks.

## 2016-02-29 ENCOUNTER — Encounter (HOSPITAL_COMMUNITY): Payer: Self-pay | Admitting: Emergency Medicine

## 2017-08-29 IMAGING — CR DG KNEE COMPLETE 4+V*R*
4 series · 4 of 4 positions shown · non-contrast
Comparison: 10/09/2013

CLINICAL DATA: Lateral to mid right knee pain today. No injury.
Knot on the lateral side of the patella.

EXAM:
RIGHT KNEE - COMPLETE 4+ VIEW

[x knee ap right]
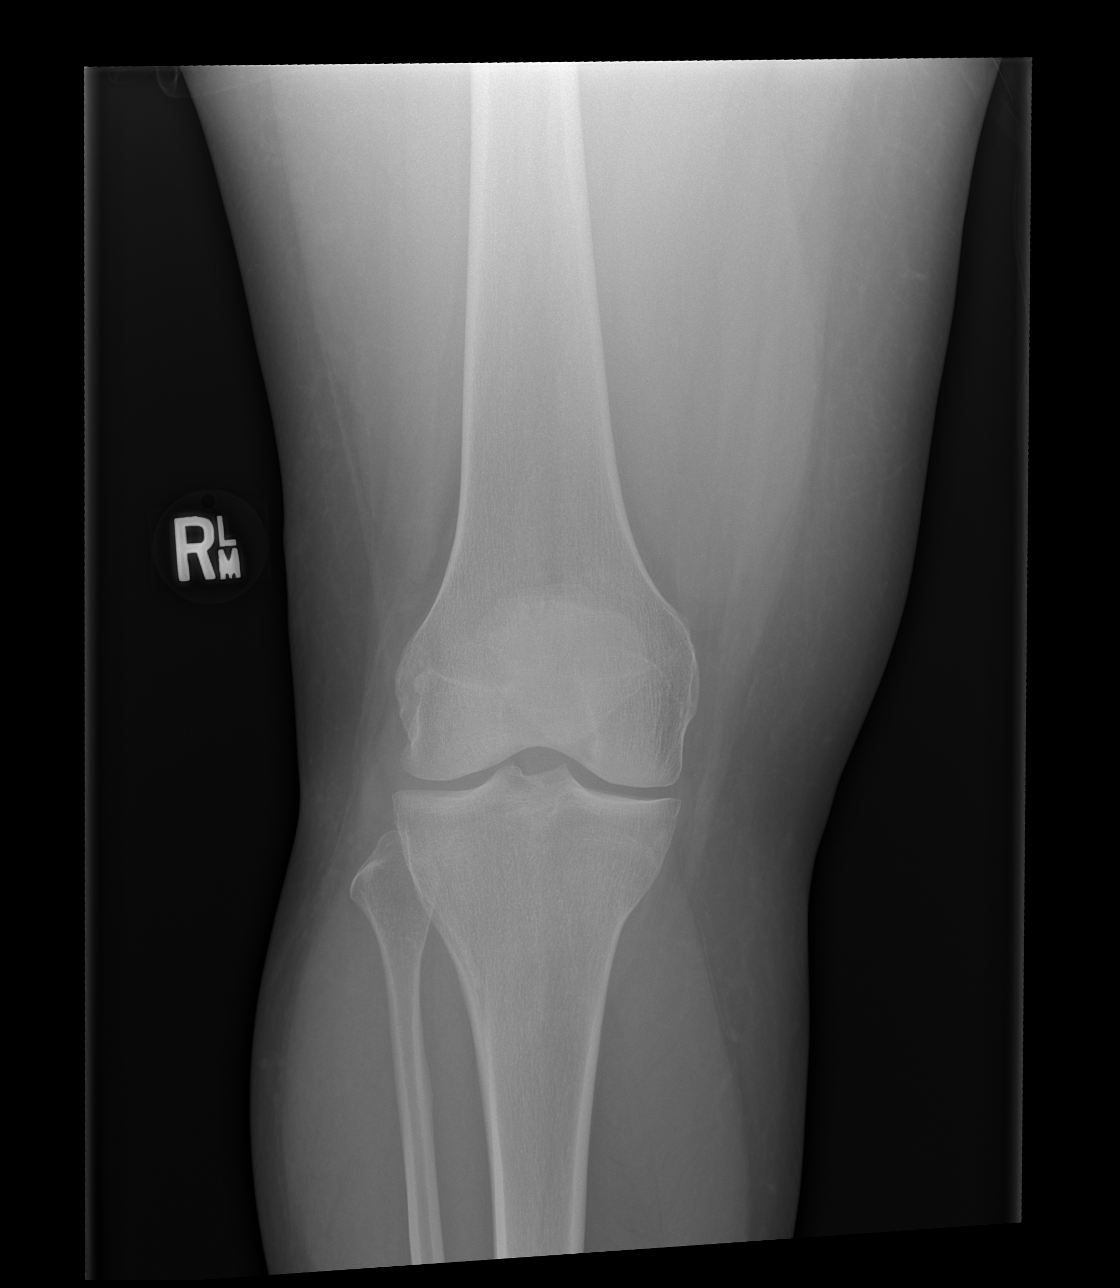

[x knee obl right (1 of 2)]
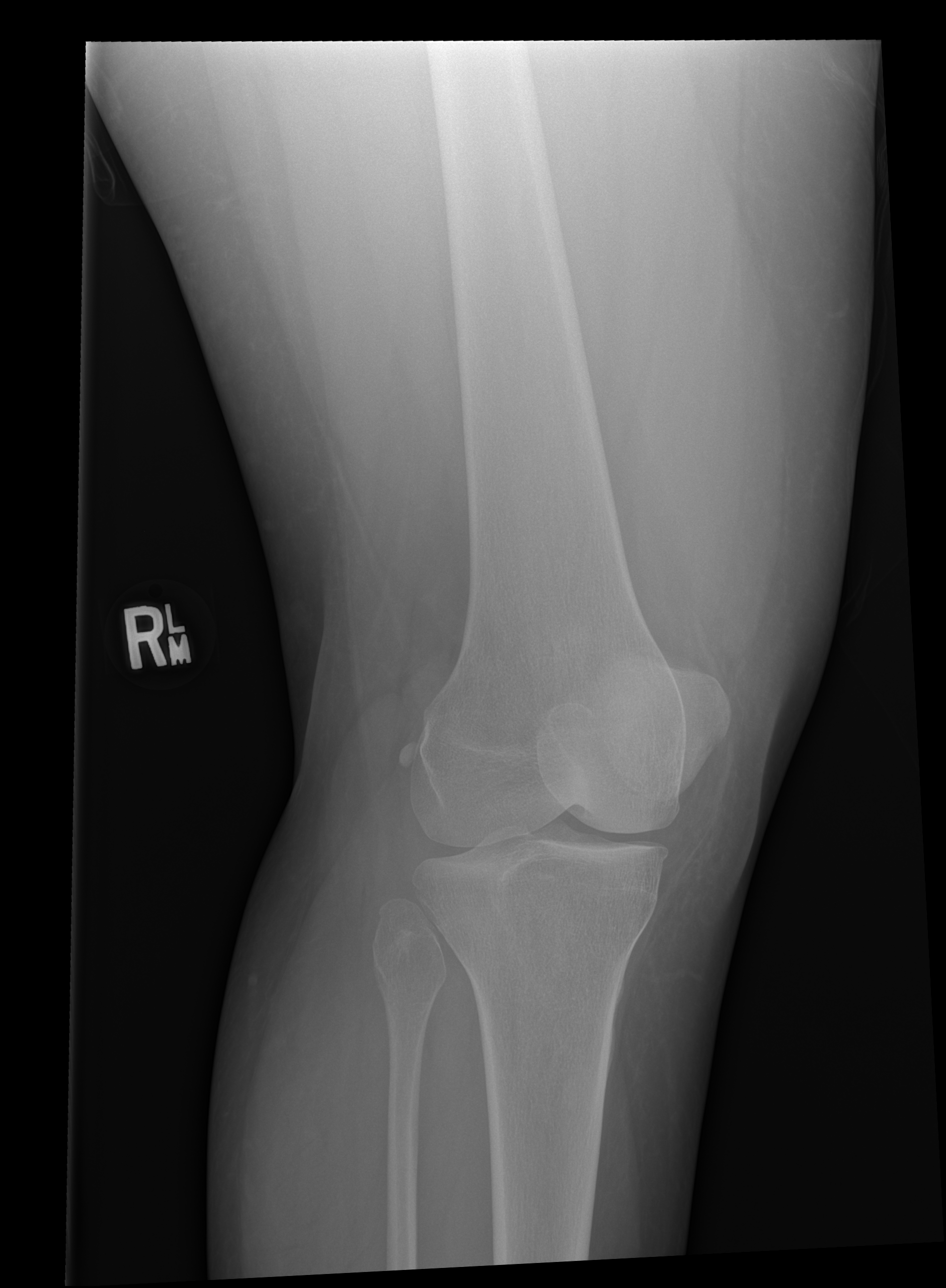

[x knee obl right (2 of 2)]
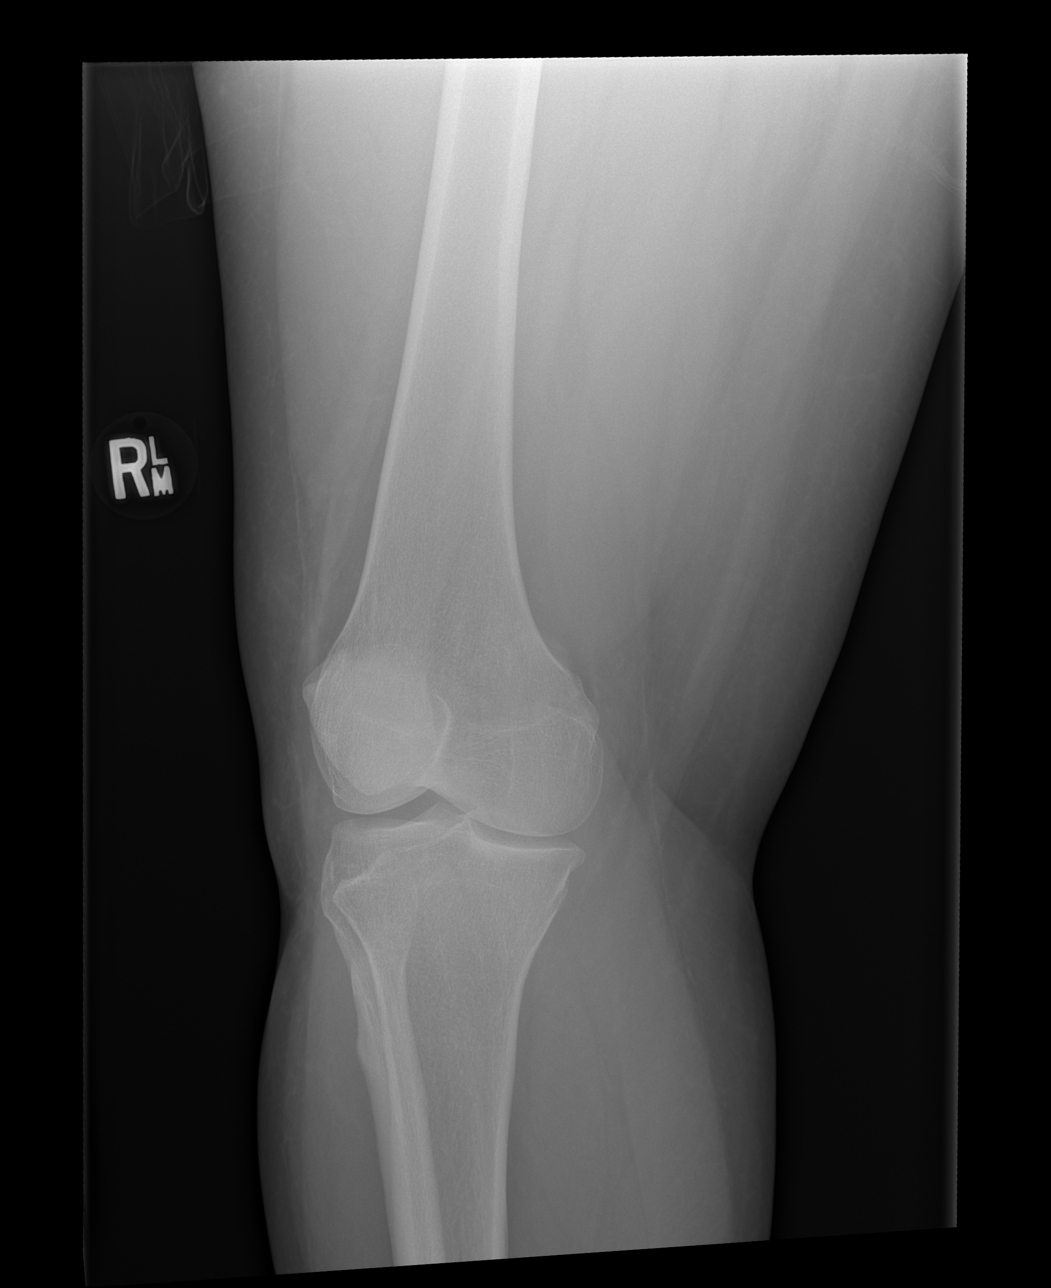

[x knee lat right]
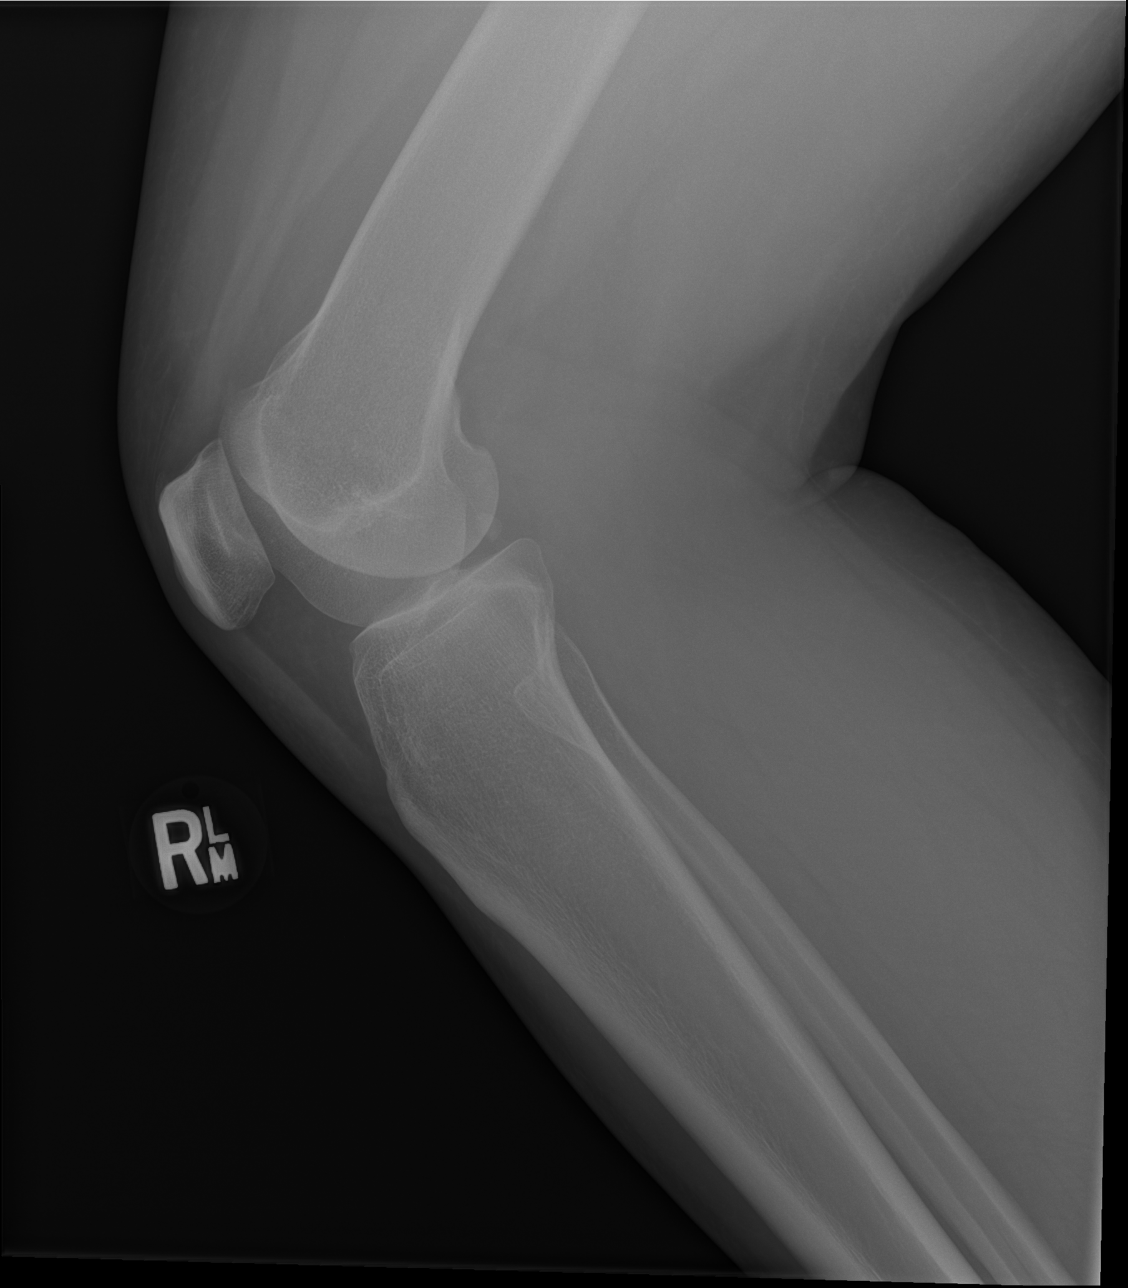

[4 of 4 positions shown; findings below may reference images not displayed]

FINDINGS: There is no evidence of fracture, dislocation, or joint effusion.
There is no evidence of arthropathy or other focal bone abnormality.
Soft tissues are unremarkable.
IMPRESSION: Negative.
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
